# Patient Record
Sex: Male | Born: 2000 | Race: Black or African American | Hispanic: No | Marital: Single | Smoking: Current some day smoker
Health system: Southern US, Community
[De-identification: ages and names within clinical notes are randomized; demographics above are authoritative.]

## PROBLEM LIST (undated history)

## (undated) DIAGNOSIS — J45909 Unspecified asthma, uncomplicated: Secondary | ICD-10-CM

## (undated) HISTORY — PX: APPENDECTOMY: SHX54

---
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
## 2022-03-06 ENCOUNTER — Encounter (HOSPITAL_COMMUNITY): Payer: Self-pay | Admitting: Emergency Medicine

## 2022-03-06 ENCOUNTER — Emergency Department (HOSPITAL_COMMUNITY)
Admission: EM | Admit: 2022-03-06 | Discharge: 2022-03-06 | Discharge status: Against Medical Advice | Payer: Self-pay | Attending: Emergency Medicine | Admitting: Emergency Medicine

## 2022-03-06 ENCOUNTER — Emergency Department (HOSPITAL_COMMUNITY): Payer: Self-pay

## 2022-03-06 ENCOUNTER — Other Ambulatory Visit: Payer: Self-pay

## 2022-03-06 DIAGNOSIS — R059 Cough, unspecified: Secondary | ICD-10-CM | POA: Insufficient documentation

## 2022-03-06 DIAGNOSIS — J45909 Unspecified asthma, uncomplicated: Secondary | ICD-10-CM | POA: Insufficient documentation

## 2022-03-06 DIAGNOSIS — R0981 Nasal congestion: Secondary | ICD-10-CM | POA: Insufficient documentation

## 2022-03-06 DIAGNOSIS — Z20822 Contact with and (suspected) exposure to covid-19: Secondary | ICD-10-CM | POA: Insufficient documentation

## 2022-03-06 DIAGNOSIS — Z5321 Procedure and treatment not carried out due to patient leaving prior to being seen by health care provider: Secondary | ICD-10-CM | POA: Insufficient documentation

## 2022-03-06 LAB — RESP PANEL BY RT-PCR (FLU A&B, COVID) ARPGX2
Influenza A by PCR: NEGATIVE
Influenza B by PCR: NEGATIVE
SARS Coronavirus 2 by RT PCR: NEGATIVE

## 2022-03-06 MED ORDER — ALBUTEROL SULFATE HFA 108 (90 BASE) MCG/ACT IN AERS
2.0000 | INHALATION_SPRAY | Freq: Once | RESPIRATORY_TRACT | Status: AC
  Administered 2022-03-06: 2 via RESPIRATORY_TRACT
  Filled 2022-03-06: qty 6.7

## 2022-03-06 NOTE — ED Triage Notes (Signed)
Pt reports cough, chest congestion, wheezing starting today, pt reports hx of asthma; denies n/v/d/abd pain/weakness

## 2022-03-06 NOTE — ED Provider Triage Note (Signed)
Emergency Medicine Provider Triage Evaluation Note  Daniel Mooney , a 21 y.o. male  was evaluated in triage.  Pt complains of cough and congestion that began today.  Feels like he is wheezing.  Denies sick contacts or known covid exposures.  Hx of asthma, does not have inhaler to use.  Review of Systems  Positive: Congestion, cough, wheezing Negative: fever  Physical Exam  BP 124/80 (BP Location: Right Arm)   Pulse 64   Temp 98.1 F (36.7 C) (Oral)   Resp 15   Ht 6\' 1"  (1.854 m)   Wt 68.9 kg   SpO2 100%   BMI 20.05 kg/m  Gen:   Awake, no distress   Resp:  Normal effort, expiratory wheezes noted MSK:   Moves extremities without difficulty  Other:    Medical Decision Making  Medically screening exam initiated at 3:41 AM.  Appropriate orders placed.  Daniel Mooney was informed that the remainder of the evaluation will be completed by another provider, this initial triage assessment does not replace that evaluation, and the importance of remaining in the ED until their evaluation is complete.  Cough, congestion.  Some wheezing on exam but in NAD, VSS.  Covid/flu screen sent, will obtain CXR.  Albuterol inhaler given for use.   Larene Pickett, PA-C 03/06/22 [phone]

## 2022-03-06 NOTE — ED Notes (Signed)
X2 no response and not visible in the lobby 

## 2022-06-05 ENCOUNTER — Ambulatory Visit (HOSPITAL_COMMUNITY)
Admission: EM | Admit: 2022-06-05 | Discharge: 2022-06-05 | Disposition: A | Discharge status: Home / Self Care | Payer: Self-pay

## 2022-06-06 ENCOUNTER — Ambulatory Visit (HOSPITAL_COMMUNITY): Payer: Self-pay

## 2022-07-11 ENCOUNTER — Emergency Department (HOSPITAL_COMMUNITY)
Admission: EM | Admit: 2022-07-11 | Discharge: 2022-07-11 | Disposition: A | Discharge status: Home / Self Care | Payer: Self-pay | Attending: Emergency Medicine | Admitting: Emergency Medicine

## 2022-07-11 ENCOUNTER — Other Ambulatory Visit: Payer: Self-pay

## 2022-07-11 ENCOUNTER — Encounter (HOSPITAL_COMMUNITY): Payer: Self-pay

## 2022-07-11 DIAGNOSIS — Z7951 Long term (current) use of inhaled steroids: Secondary | ICD-10-CM | POA: Insufficient documentation

## 2022-07-11 DIAGNOSIS — J45909 Unspecified asthma, uncomplicated: Secondary | ICD-10-CM | POA: Insufficient documentation

## 2022-07-11 MED ORDER — ALBUTEROL SULFATE HFA 108 (90 BASE) MCG/ACT IN AERS: 2.0000 | INHALATION_SPRAY | Freq: Four times a day (QID) | RESPIRATORY_TRACT | 3 refills | Status: DC | PRN

## 2022-07-11 NOTE — ED Triage Notes (Signed)
Reports having asthma flare up and needing refill on asthma medications.

## 2022-07-11 NOTE — ED Provider Notes (Signed)
[facility] Provider Note   CSN: [id number] Arrival date & time: 07/11/22  [id number]     History  Chief Complaint  Patient presents with   Medication Refill    Daniel Mooney is a 22 y.o. male.  Patient complains of having had an asthma attack this morning.  Patient reports that his albuterol inhaler was out of medication.  Reports he is not currently wheezing he feels fine now  The history is provided by the patient. No language interpreter was used.  Medication Refill Medications/supplies requested:  Albuterol Reason for request:  Medications ran out Medications taken before: yes - see home medications   Patient has complete original prescription information: no        Home Medications Prior to Admission medications   Medication Sig Start Date End Date Taking? Authorizing Provider  albuterol (VENTOLIN HFA) 108 (90 Base) MCG/ACT inhaler Inhale 2 puffs into the lungs every 6 (six) hours as needed for wheezing or shortness of breath. 07/11/22  Yes Fransico Meadow, PA-C  cetirizine (ZYRTEC ALLERGY) 10 MG tablet Take 1 tablet (10 mg total) by mouth daily. 05/26/18   Willadean Carol, MD  diphenhydrAMINE (BENADRYL) 25 MG tablet Take 1 tablet (25 mg total) by mouth every 6 (six) hours as needed for itching or allergies (Rash). 08/15/14   Antonietta Breach, PA-C  EPINEPHrine (EPIPEN 2-PAK) 0.3 mg/0.3 mL IJ SOAJ injection Inject 0.3 mLs (0.3 mg total) into the muscle once as needed (for severe allergic reaction). CAll 911 immediately if you have to use this medicine 08/15/14   Antonietta Breach, PA-C  fluticasone (FLOVENT HFA) 110 MCG/ACT inhaler Inhale 2 puffs into the lungs 2 (two) times daily. 05/26/18   Willadean Carol, MD  ibuprofen (ADVIL,MOTRIN) 400 MG tablet Take 1 tablet (400 mg total) by mouth every 6 (six) hours as needed. 10/24/17   Zigmund Gottron, NP  predniSONE (DELTASONE) 20 MG tablet Take 3 tablets (60 mg total) by mouth daily. For 4 more  days 02/02/17   Domenic Moras, PA-C      Allergies    Shrimp [shellfish allergy] and Penicillins    Review of Systems   Review of Systems  All other systems reviewed and are negative.   Physical Exam Updated Vital Signs BP 115/74 (BP Location: Right Arm)   Pulse 66   Temp 97.9 F (36.6 C) (Oral)   Resp 14   Ht 6' 1"$  (1.854 m)   Wt 68.9 kg   SpO2 100%   BMI 20.05 kg/m  Physical Exam Vitals and nursing note reviewed.  Constitutional:      Appearance: He is well-developed.  HENT:     Head: Normocephalic.  Cardiovascular:     Rate and Rhythm: Normal rate.  Pulmonary:     Effort: Pulmonary effort is normal.  Abdominal:     General: There is no distension.  Musculoskeletal:        General: Normal range of motion.     Cervical back: Normal range of motion.  Neurological:     Mental Status: He is alert and oriented to person, place, and time.     ED Results / Procedures / Treatments   Labs (all labs ordered are listed, but only abnormal results are displayed) Labs Reviewed - No data to display  EKG None  Radiology No results found.  Procedures Procedures    Medications Ordered in ED Medications - No data to display  ED Course/ Medical Decision  Making/ A&P                             Medical Decision Making She reports he had an asthma attack and ran out of his albuterol he is here requesting a prescription for albuterol  Risk Prescription drug management. Risk Details: Is not having any current wheezing his oxygen saturations are at 100% he looks well overall patient is given a prescription for albuterol           Final Clinical Impression(s) / ED Diagnoses Final diagnoses:  Moderate asthma without complication, unspecified whether persistent    Rx / DC Orders ED Discharge Orders          Ordered    albuterol (VENTOLIN HFA) 108 (90 Base) MCG/ACT inhaler  Every 6 hours PRN        07/11/22 0921          An After Visit Summary was  printed and given to the patient.     Fransico Meadow, [place] 07/11/22 [id number]    Varney Biles, MD 07/14/22 [phone]

## 2022-09-03 ENCOUNTER — Other Ambulatory Visit: Payer: Self-pay

## 2022-09-03 ENCOUNTER — Encounter (HOSPITAL_COMMUNITY): Payer: Self-pay | Admitting: Emergency Medicine

## 2022-09-03 ENCOUNTER — Emergency Department (HOSPITAL_COMMUNITY)
Admission: EM | Admit: 2022-09-03 | Discharge: 2022-09-04 | Disposition: A | Discharge status: Home / Self Care | Payer: Self-pay | Attending: Emergency Medicine | Admitting: Emergency Medicine

## 2022-09-03 ENCOUNTER — Emergency Department (HOSPITAL_COMMUNITY): Payer: Self-pay

## 2022-09-03 DIAGNOSIS — N451 Epididymitis: Secondary | ICD-10-CM

## 2022-09-03 LAB — URINALYSIS, ROUTINE W REFLEX MICROSCOPIC
Bilirubin Urine: NEGATIVE
Glucose, UA: NEGATIVE mg/dL
Hgb urine dipstick: NEGATIVE
Ketones, ur: NEGATIVE mg/dL
Nitrite: NEGATIVE
Protein, ur: NEGATIVE mg/dL
Specific Gravity, Urine: 1.016 (ref 1.005–1.030)
pH: 5 (ref 5.0–8.0)

## 2022-09-03 MED ORDER — DOXYCYCLINE HYCLATE 100 MG PO TABS: 100.0000 mg | ORAL_TABLET | Freq: Two times a day (BID) | ORAL | 0 refills | Status: AC

## 2022-09-03 MED ORDER — CEFTRIAXONE SODIUM 1 G IJ SOLR
500.0000 mg | Freq: Once | INTRAMUSCULAR | Status: AC
  Administered 2022-09-04: 500 mg via INTRAMUSCULAR
  Filled 2022-09-03: qty 10

## 2022-09-03 MED ORDER — LIDOCAINE HCL (PF) 1 % IJ SOLN
1.0000 mL | Freq: Once | INTRAMUSCULAR | Status: AC
  Administered 2022-09-04: 2.1 mL
  Filled 2022-09-03: qty 30

## 2022-09-03 NOTE — ED Triage Notes (Signed)
Pt in with R groin pain and swelling since 3/27. Pt states he "had intercourse on 3/26 and didn't finish, noticed these symptoms the next day". Denies any problems with urination, is requesting STI check.

## 2022-09-03 NOTE — ED Provider Triage Note (Signed)
Emergency Medicine Provider Triage Evaluation Note  Daniel Mooney , a 22 y.o. male  was evaluated in triage.  Pt complains of scrotal pain. Report discomfort to both testicles x 5 days after sex. Voice concerns for STI.  Denies penile discharge, dysuria, rash, or swelling.  Review of Systems  Positive: As above Negative: As above  Physical Exam  BP 125/73 (BP Location: Right Arm)   Pulse 80   Temp 98.5 F (36.9 C) (Oral)   Resp 16   Ht 6\' 1"  (1.854 m)   Wt 72.6 kg   SpO2 100%   BMI 21.11 kg/m  Gen:   Awake, no distress   Resp:  Normal effort  MSK:   Moves extremities without difficulty  Other:    Medical Decision Making  Medically screening exam initiated at 8:40 PM.  Appropriate orders placed.  Daniel Mooney was informed that the remainder of the evaluation will be completed by another provider, this initial triage assessment does not replace that evaluation, and the importance of remaining in the ED until their evaluation is complete.     Domenic Moras, PA-C 09/03/22 2040

## 2022-09-03 NOTE — Discharge Instructions (Signed)
You have been treated in the emergency department for an infection, possibly sexually transmitted.   Please take all of your antibiotics until finished!   You may develop abdominal discomfort or diarrhea from the antibiotic.  You may help offset this with probiotics which you can buy or get in yogurt. Do not eat  or take the probiotics until 2 hours after your antibiotic.    Results of your gonorrhea and chlamydia tests are pending and you will be notified if they are positive. Please refrain from intercourse for 7 days and until all sex partners (within previous 60 days) are evaluated and/or treated as well. Please follow up with your primary care provider for continued care and further STD evaluation.  It is very important to practice safe sex and use condoms when sexually active. If your results are positive you need to notify all sexual partners so they can be treated as well. The website https://dean.info/ can be used to send anonymous text messages or emails to alert sexual contacts. Follow up with your doctor, or OBGYN in regards to today's visit.   Gonorrhea and Chlamydia SYMPTOMS  In females, symptoms may go unnoticed. Symptoms that are more noticeable can include:  Belly (abdominal) pain.  Painful intercourse.  Watery mucous-like discharge from the vagina.  Miscarriage.  Discomfort when urinating.  Inflammation of the rectum.  Abnormal gray-green frothy vaginal discharge  Vaginal itching and irritatio  Itching and irritation of the area outside the vagina.   Painful urination.  Bleeding after sexual intercourse.  In males, symptoms include:  Burning with urination.  Pain in the testicles.  Watery mucous-like discharge from the penis.  It can cause longstanding (chronic) pelvic pain after frequent infections.  TREATMENT  PID can cause women to not be able to have children (sterile) if left untreated or if half-treated.  It is important to finish ALL medications given to  you.  This is a sexually transmitted infection. So you are also at risk for other sexually transmitted diseases, including HIV (AIDS), it is recommended that you get tested. HOME CARE INSTRUCTIONS  Warning: This infection is contagious. Do not have sex until treatment is completed. Follow up at your caregiver's office or the clinic to which you were referred. If your diagnosis (learning what is wrong) is confirmed by culture or some other method, your recent sexual contacts need treatment. Even if they are symptom free or have a negative culture or evaluation, they should be treated.  PREVENTION  Women should use sanitary pads instead of tampons for vaginal discharge.  Wipe front to back after using the toilet and avoid douching.   Practice safe sex, use condoms, have only one sex partner and be sure your sex partner is not having sex with others.  Ask your caregiver to test you for chlamydia at your regular checkups or sooner if you are having symptoms.  Ask for further information if you are pregnant.  SEEK IMMEDIATE MEDICAL CARE IF:  You develop an oral temperature above 102 F (38.9 C), not controlled by medications or lasting more than 2 days.  You develop an increase in pain.  You develop any type of abnormal discharge.  You develop vaginal bleeding and it is not time for your period.  You develop painful intercourse.   Bacterial Vaginosis  Bacterial vaginosis (BV) is a vaginal infection where the normal balance of bacteria in the vagina is disrupted. This is not a sexually transmitted disease and your sexual partners do NOT need  to be treated. CAUSES  The cause of BV is not fully understood. BV develops when there is an increase or imbalance of harmful bacteria.  Some activities or behaviors can upset the normal balance of bacteria in the vagina and put women at increased risk including:  Having a new sex partner or multiple sex partners.  Douching.  Using an intrauterine device (IUD)  for contraception.  It is not clear what role sexual activity plays in the development of BV. However, women that have never had sexual intercourse are rarely infected with BV.  Women do not get BV from toilet seats, bedding, swimming pools or from touching objects around them.   SYMPTOMS  Grey vaginal discharge.  A fish-like odor with discharge, especially after sexual intercourse.  Itching or burning of the vagina and vulva.  Burning or pain with urination.  Some women have no signs or symptoms at all.   TREATMENT  Sometimes BV will clear up without treatment.  BV may be treated with antibiotics.  BV can recur after treatment. If this happens, a second round of antibiotics will often be prescribed.  HOME CARE INSTRUCTIONS  Finish all medication as directed by your caregiver.  Do not have sex until treatment is completed.  Do NOT drink any alcoholic beverages while being treated  with Metronidazole (Flagyl). This will cause a severe reaction inducing vomiting.  RESOURCE GUIDE  Dental Problems  Patients with Medicaid: [facility] Phone:  [phone]                                                  Phone:  [phone]  If unable to pay or uninsured, contact:  [facility] or [facility]. to become qualified for the adult dental clinic.  Chronic Pain Problems Contact [facility]  [phone] Patients need to be referred by their primary care doctor.  Insufficient Money for Medicine Contact [employer]:  call "211" or [place] [phone].  No Primary Care Doctor Call [facility]  [phone] Other agencies that provide inexpensive medical care    [place]  [phone]    [facility] Internal Medicine  [place]  [phone]    [facility]  [phone]    [facility]  [place]  [place]  [phone] [place]   [phone] (emergency services [phone])  [facility]  [phone] [facility] [phone] [facility] [place] [phone] [place] [phone] Residential & Outpatient Substance Abuse Program  [phone]  Abuse/Neglect [place] [phone] [place] [phone] (After Hours)  Emergency [place] [phone]  [place] [facility] [place] [phone] [place] [phone]  MRSA  Hotline #:   ([phone]    [facility] Dept. [address]      [place] Phone:  [phone]                                   Phone:  [phone]                 Phone:  [place] Phone:  [place] [phone] (After Hours)

## 2022-09-03 NOTE — ED Provider Notes (Signed)
[place] EMERGENCY DEPARTMENT AT [facility] Provider Note   CSN: [id number] Arrival date & time: 09/03/22  1909     History  Chief Complaint  Patient presents with   Groin Pain    Daniel Mooney is a 22 y.o. male.   Groin Pain  Patient is a 22 year old male with no pertinent past medical history present emergency room today with complaints of right testicle pain since approximately 4 5 days ago.  He states that he has had unprotected sex with male partners over the past few weeks.  He states that he noticed the testicle pain in his right testicle approximately 24 hours after he had sex with his male partner however he had had prior sexual encounters with her without protection.  He denies any penile discharge no abdominal pain nausea vomiting fevers lightheadedness or dizziness no urinary frequency urgency dysuria or hematuria.     Home Medications Prior to Admission medications   Medication Sig Start Date End Date Taking? Authorizing Provider  doxycycline (VIBRA-TABS) 100 MG tablet Take 1 tablet (100 mg total) by mouth 2 (two) times daily for 7 days. 09/03/22 09/10/22 Yes Noemi Ishmael S, PA  albuterol (VENTOLIN HFA) 108 (90 Base) MCG/ACT inhaler Inhale 2 puffs into the lungs every 6 (six) hours as needed for wheezing or shortness of breath. 07/11/22   Fransico Meadow, PA-C  cetirizine (ZYRTEC ALLERGY) 10 MG tablet Take 1 tablet (10 mg total) by mouth daily. 05/26/18   Willadean Carol, MD  diphenhydrAMINE (BENADRYL) 25 MG tablet Take 1 tablet (25 mg total) by mouth every 6 (six) hours as needed for itching or allergies (Rash). 08/15/14   Antonietta Breach, PA-C  EPINEPHrine (EPIPEN 2-PAK) 0.3 mg/0.3 mL IJ SOAJ injection Inject 0.3 mLs (0.3 mg total) into the muscle once as needed (for severe allergic reaction). CAll 911 immediately if you have to use this medicine 08/15/14   Antonietta Breach, PA-C  fluticasone (FLOVENT HFA) 110 MCG/ACT inhaler Inhale 2 puffs into the lungs 2  (two) times daily. 05/26/18   Willadean Carol, MD  ibuprofen (ADVIL,MOTRIN) 400 MG tablet Take 1 tablet (400 mg total) by mouth every 6 (six) hours as needed. 10/24/17   Zigmund Gottron, NP  predniSONE (DELTASONE) 20 MG tablet Take 3 tablets (60 mg total) by mouth daily. For 4 more days 02/02/17   Domenic Moras, PA-C      Allergies    Shrimp [shellfish allergy] and Penicillins    Review of Systems   Review of Systems  Physical Exam Updated Vital Signs BP 125/73 (BP Location: Right Arm)   Pulse 80   Temp 98.5 F (36.9 C) (Oral)   Resp 16   Ht 6\' 1"  (1.854 m)   Wt 72.6 kg   SpO2 100%   BMI 21.11 kg/m  Physical Exam Vitals and nursing note reviewed.  Constitutional:      General: He is not in acute distress.    Appearance: Normal appearance. He is not ill-appearing.  HENT:     Head: Normocephalic and atraumatic.  Eyes:     General: No scleral icterus.       Right eye: No discharge.        Left eye: No discharge.     Conjunctiva/sclera: Conjunctivae normal.  Pulmonary:     Effort: Pulmonary effort is normal.     Breath sounds: No stridor.  Genitourinary:    Comments: R testicle TTP Normal lie of testes  Neurological:  Mental Status: He is alert and oriented to person, place, and time. Mental status is at baseline.     ED Results / Procedures / Treatments   Labs (all labs ordered are listed, but only abnormal results are displayed) Labs Reviewed  URINALYSIS, ROUTINE W REFLEX MICROSCOPIC - Abnormal; Notable for the following components:      Result Value   Leukocytes,Ua SMALL (*)    Bacteria, UA RARE (*)    All other components within normal limits  RPR  HIV ANTIBODY (ROUTINE TESTING W REFLEX)  GC/CHLAMYDIA PROBE AMP ([place]) NOT AT [facility]    EKG None  Radiology US SCROTUM W/DOPPLER  Result Date: 09/03/2022 CLINICAL DATA:  Scrotal pain for 5 days EXAM: SCROTAL ULTRASOUND DOPPLER ULTRASOUND OF THE TESTICLES TECHNIQUE: Complete ultrasound examination of  the testicles, epididymis, and other scrotal structures was performed. Color and spectral Doppler ultrasound were also utilized to evaluate blood flow to the testicles. COMPARISON:  10/21/2019 FINDINGS: Right testicle Measurements: 3.3 x 5.0 x 2.6 cm. No mass or microlithiasis visualized. Left testicle Measurements: 3.3 x 5.3 x 2.7 cm. No mass or microlithiasis visualized. Right epididymis: The right epididymis is enlarged and hypervascular, consistent with epididymitis. Left epididymis:  Normal in size and appearance. Hydrocele:  Trace bilateral hydroceles. Varicocele:  Bilateral varicoceles, left greater than right. Pulsed Doppler interrogation of both testes demonstrates normal low resistance arterial and venous waveforms bilaterally. IMPRESSION: 1. Enlarged hypervascular right epididymis consistent with epididymitis. 2. Bilateral varicoceles, left greater than right. 3. Trace bilateral hydroceles. 4. Normal appearance of the testicles. Electronically Signed   By: Randa Ngo M.D.   On: 09/03/2022 21:56    Procedures Procedures    Medications Ordered in ED Medications  cefTRIAXone (ROCEPHIN) injection 500 mg (has no administration in time range)  lidocaine (PF) (XYLOCAINE) 1 % injection 1-2.1 mL (has no administration in time range)    ED Course/ Medical Decision Making/ A&P                             Medical Decision Making  Patient is a 22 year old male with no pertinent past medical history present emergency room today with complaints of right testicle pain since approximately 4 5 days ago.  He states that he has had unprotected sex with male partners over the past few weeks.  He states that he noticed the testicle pain in his right testicle approximately 24 hours after he had sex with his male partner however he had had prior sexual encounters with her without protection.  He denies any penile discharge no abdominal pain nausea vomiting fevers lightheadedness or dizziness no urinary  frequency urgency dysuria or hematuria.   R testicle w evidence of epididymitis  IMPRESSION:  1. Enlarged hypervascular right epididymis consistent with  epididymitis.  2. Bilateral varicoceles, left greater than right.  3. Trace bilateral hydroceles.  4. Normal appearance of the testicles.   UA unremarkable some rare bacteria and small leukocytes but no urinary symptoms.  Gonorrhea chlamydia, HIV, RPR all pending.  Normal vital signs well-appearing abdomen soft nontender.  Will discharge home with treatment for epididymitis given age and risk factors will treat as STI related.  Patient is not at risk for enteric organisms.  Rocephin IM given here and doxycycline for home.  Will follow-up with the health department.  Final Clinical Impression(s) / ED Diagnoses Final diagnoses:  Epididymitis    Rx / DC Orders ED Discharge Orders  Ordered    doxycycline (VIBRA-TABS) 100 MG tablet  2 times daily        09/03/22 2255              Gailen Shelter, [place] 09/03/22 2345    Nira Conn, MD 09/08/22 1739

## 2022-09-04 LAB — GC/CHLAMYDIA PROBE AMP (~~LOC~~) NOT AT ARMC
Chlamydia: NEGATIVE
Comment: NEGATIVE
Comment: NORMAL
Neisseria Gonorrhea: NEGATIVE

## 2022-09-04 LAB — RPR: RPR Ser Ql: NONREACTIVE

## 2022-09-04 LAB — HIV ANTIBODY (ROUTINE TESTING W REFLEX): HIV Screen 4th Generation wRfx: NONREACTIVE

## 2022-09-04 NOTE — ED Notes (Signed)
Patient verbalizes understanding of discharge instructions. Opportunity for questioning and answers were provided. Armband removed by staff, pt discharged from ED. Ambulated out to lobby  

## 2022-11-03 LAB — AMB RESULTS CONSOLE CBG: Glucose: 149

## 2022-11-05 NOTE — Progress Notes (Unsigned)
Patient does not have PCP, was given a Get Care Now resource paper. Patient given Food insecurity resource for SDOH need.

## 2022-12-10 ENCOUNTER — Encounter: Payer: Self-pay | Admitting: *Deleted

## 2022-12-10 NOTE — Progress Notes (Unsigned)
Pt attended 11/03/22 screening event where b/p was 120/78 and blood sugar was 149. At the event pt noted he did not have a PCP and was given a Get Care Now flyer; and he identified a food insecurity for which we was given resources. Chart review indicates pt has used Urgent care and ED for care over the past year. Calls made to try to contact pt about connection to a PCP.

## 2023-02-13 ENCOUNTER — Encounter: Payer: Self-pay | Admitting: *Deleted

## 2023-02-13 NOTE — Progress Notes (Signed)
Pt attended 11/05/22 screening event where his b/p was 120/78 and his blood sugar was 149 . The pt did not identify a PCP at the event and did identify a food insecurity for which the pt was given a resource list as well as a Get Care Now flyer. During the initial f/u, health equity team member was unable to contact pt by phone and sent letter with event results, another Get Care Now flyer (in case needed), a [facility] flyer. In addition, since pt has been to the ED and no insurance has been listed on his account, Medicaid and ACA info also sent, along with a list of food pantries and free meal sites since pt listed a food insecurity at the event Since current chart review does not demonstrate any [facility] documentation since event for PCP or other healthcare encounter, same info mailed again to pt with request to please establish care with PCP.

## 2023-03-31 ENCOUNTER — Emergency Department (HOSPITAL_COMMUNITY)
Admission: EM | Admit: 2023-03-31 | Discharge: 2023-03-31 | Disposition: A | Discharge status: Home / Self Care | Payer: Self-pay | Attending: Emergency Medicine | Admitting: Emergency Medicine

## 2023-03-31 ENCOUNTER — Other Ambulatory Visit: Payer: Self-pay

## 2023-03-31 ENCOUNTER — Encounter (HOSPITAL_COMMUNITY): Payer: Self-pay | Admitting: Pharmacy Technician

## 2023-03-31 ENCOUNTER — Emergency Department (HOSPITAL_COMMUNITY): Payer: Self-pay

## 2023-03-31 DIAGNOSIS — F17210 Nicotine dependence, cigarettes, uncomplicated: Secondary | ICD-10-CM | POA: Insufficient documentation

## 2023-03-31 DIAGNOSIS — Z7951 Long term (current) use of inhaled steroids: Secondary | ICD-10-CM | POA: Insufficient documentation

## 2023-03-31 DIAGNOSIS — J4521 Mild intermittent asthma with (acute) exacerbation: Secondary | ICD-10-CM | POA: Insufficient documentation

## 2023-03-31 DIAGNOSIS — Z20822 Contact with and (suspected) exposure to covid-19: Secondary | ICD-10-CM | POA: Insufficient documentation

## 2023-03-31 LAB — BASIC METABOLIC PANEL
Anion gap: 12 (ref 5–15)
BUN: 7 mg/dL (ref 6–20)
CO2: 23 mmol/L (ref 22–32)
Calcium: 9.4 mg/dL (ref 8.9–10.3)
Chloride: 105 mmol/L (ref 98–111)
Creatinine, Ser: 1.08 mg/dL (ref 0.61–1.24)
GFR, Estimated: >60 mL/min (ref >60)
Glucose, Bld: 85 mg/dL (ref 70–99)
Potassium: 4 mmol/L (ref 3.5–5.1)
Sodium: 140 mmol/L (ref 135–145)

## 2023-03-31 LAB — CBC WITH DIFFERENTIAL/PLATELET
Abs Immature Granulocytes: 0 10*3/uL (ref 0.00–0.07)
Basophils Absolute: 0 10*3/uL (ref 0.0–0.1)
Basophils Relative: 1 %
Eosinophils Absolute: 0.1 10*3/uL (ref 0.0–0.5)
Eosinophils Relative: 3 %
HCT: 34.5 % — ABNORMAL LOW (ref 39.0–52.0)
Hemoglobin: 11.4 g/dL — ABNORMAL LOW (ref 13.0–17.0)
Immature Granulocytes: 0 %
Lymphocytes Relative: 33 %
Lymphs Abs: 1.4 10*3/uL (ref 0.7–4.0)
MCH: 29.2 pg (ref 26.0–34.0)
MCHC: 33 g/dL (ref 30.0–36.0)
MCV: 88.2 fL (ref 80.0–100.0)
Monocytes Absolute: 0.3 10*3/uL (ref 0.1–1.0)
Monocytes Relative: 7 %
Neutro Abs: 2.5 10*3/uL (ref 1.7–7.7)
Neutrophils Relative %: 56 %
Platelets: 146 10*3/uL — ABNORMAL LOW (ref 150–400)
RBC: 3.91 MIL/uL — ABNORMAL LOW (ref 4.22–5.81)
RDW: 12.8 % (ref 11.5–15.5)
WBC: 4.3 10*3/uL (ref 4.0–10.5)
nRBC: 0 % (ref 0.0–0.2)

## 2023-03-31 LAB — SARS CORONAVIRUS 2 BY RT PCR: SARS Coronavirus 2 by RT PCR: NEGATIVE

## 2023-03-31 MED ORDER — MAGNESIUM SULFATE 2 GM/50ML IV SOLN
2.0000 g | Freq: Once | INTRAVENOUS | Status: AC
  Administered 2023-03-31: 2 g via INTRAVENOUS
  Filled 2023-03-31: qty 50

## 2023-03-31 MED ORDER — PREDNISONE 10 MG PO TABS: 20.0000 mg | ORAL_TABLET | Freq: Every day | ORAL | 0 refills | Status: AC

## 2023-03-31 MED ORDER — IPRATROPIUM-ALBUTEROL 0.5-2.5 (3) MG/3ML IN SOLN
3.0000 mL | Freq: Once | RESPIRATORY_TRACT | Status: AC
  Administered 2023-03-31: 3 mL via RESPIRATORY_TRACT
  Filled 2023-03-31: qty 3

## 2023-03-31 MED ORDER — METHYLPREDNISOLONE SODIUM SUCC 125 MG IJ SOLR
125.0000 mg | Freq: Once | INTRAMUSCULAR | Status: AC
  Administered 2023-03-31: 125 mg via INTRAVENOUS
  Filled 2023-03-31: qty 2

## 2023-03-31 MED ORDER — ALBUTEROL SULFATE HFA 108 (90 BASE) MCG/ACT IN AERS: 2.0000 | INHALATION_SPRAY | Freq: Four times a day (QID) | RESPIRATORY_TRACT | 3 refills | Status: AC | PRN

## 2023-03-31 NOTE — ED Triage Notes (Signed)
Pt here with shob onset this morning. Hx asthma, ran out of inhaler last week.

## 2023-03-31 NOTE — ED Provider Notes (Signed)
[place] EMERGENCY DEPARTMENT AT [facility] Provider Note   CSN: [id number] Arrival date & time: 03/31/23  5784     History  Chief Complaint  Patient presents with   Asthma    Daniel Mooney is a 22 y.o. male history of asthma presented for an asthma exacerbation.  Patient states that he was smoking cigarettes last night and woke up this morning wheezing.  Patient states he is out of his albuterol inhaler and that he is having asthma exacerbation.  Patient states he just needs a breathing treatment.  Patient denied chest pain, Donnell pain, nauseous vomiting, recent fevers, sick contacts, headache, change in sensation/motor skills, cough, hemoptysis, leg swelling.  Home Medications Prior to Admission medications   Medication Sig Start Date End Date Taking? Authorizing Provider  predniSONE (DELTASONE) 10 MG tablet Take 2 tablets (20 mg total) by mouth daily. 03/31/23  Yes Shigeko Manard, [place] Gust, PA-C  albuterol (VENTOLIN HFA) 108 (90 Base) MCG/ACT inhaler Inhale 2 puffs into the lungs every 6 (six) hours as needed for wheezing or shortness of breath. 03/31/23   Netta Corrigan, PA-C  cetirizine (ZYRTEC ALLERGY) 10 MG tablet Take 1 tablet (10 mg total) by mouth daily. 05/26/18   Vicki Mallet, MD  diphenhydrAMINE (BENADRYL) 25 MG tablet Take 1 tablet (25 mg total) by mouth every 6 (six) hours as needed for itching or allergies (Rash). 08/15/14   Antony Madura, PA-C  EPINEPHrine (EPIPEN 2-PAK) 0.3 mg/0.3 mL IJ SOAJ injection Inject 0.3 mLs (0.3 mg total) into the muscle once as needed (for severe allergic reaction). CAll 911 immediately if you have to use this medicine 08/15/14   Antony Madura, PA-C  fluticasone (FLOVENT HFA) 110 MCG/ACT inhaler Inhale 2 puffs into the lungs 2 (two) times daily. 05/26/18   Vicki Mallet, MD  ibuprofen (ADVIL,MOTRIN) 400 MG tablet Take 1 tablet (400 mg total) by mouth every 6 (six) hours as needed. 10/24/17   Georgetta Haber, NP       Allergies    Shrimp [shellfish allergy] and Penicillins    Review of Systems   Review of Systems  Physical Exam Updated Vital Signs BP 128/84   Pulse (!) 58   Temp (!) 97.2 F (36.2 C)   Resp 18   SpO2 100%  Physical Exam Constitutional:      General: He is not in acute distress. Eyes:     Extraocular Movements: Extraocular movements intact.     Conjunctiva/sclera: Conjunctivae normal.     Pupils: Pupils are equal, round, and reactive to light.  Cardiovascular:     Rate and Rhythm: Normal rate and regular rhythm.     Pulses: Normal pulses.     Heart sounds: Normal heart sounds.  Pulmonary:     Effort: Pulmonary effort is normal. No respiratory distress.     Breath sounds: Wheezing (Right-sided) present.     Comments: Able to speak in full sentences Abdominal:     Palpations: Abdomen is soft.     Tenderness: There is no abdominal tenderness. There is no guarding or rebound.  Musculoskeletal:     Cervical back: Normal range of motion.     Right lower leg: No edema.     Left lower leg: No edema.  Skin:    General: Skin is warm and dry.     Capillary Refill: Capillary refill takes less than 2 seconds.  Neurological:     Mental Status: He is alert and oriented to person, place, and  time.  Psychiatric:        Mood and Affect: Mood normal.     ED Results / Procedures / Treatments   Labs (all labs ordered are listed, but only abnormal results are displayed) Labs Reviewed  CBC WITH DIFFERENTIAL/PLATELET - Abnormal; Notable for the following components:      Result Value   RBC 3.91 (*)    Hemoglobin 11.4 (*)    HCT 34.5 (*)    Platelets 146 (*)    All other components within normal limits  SARS CORONAVIRUS 2 BY RT PCR  BASIC METABOLIC PANEL    EKG EKG Interpretation Date/Time:  Sunday March 31 2023 08:02:03 EDT Ventricular Rate:  85 PR Interval:  133 QRS Duration:  117 QT Interval:  391 QTC Calculation: 465 R Axis:   85  Text Interpretation: Sinus  arrhythmia Nonspecific intraventricular conduction delay Baseline wander in lead(s) V1 Confirmed by Kristine Royal [phone]) on 03/31/2023 8:13:28 AM  Radiology DG Chest 2 View  Result Date: 03/31/2023 CLINICAL DATA:  Short of breath EXAM: CHEST - 2 VIEW COMPARISON:  None Available. FINDINGS: Normal mediastinum and cardiac silhouette. Normal pulmonary vasculature. No evidence of effusion, infiltrate, or pneumothorax. No acute bony abnormality. IMPRESSION: Normal chest radiograph Electronically Signed   By: Genevive Bi M.D.   On: 03/31/2023 09:18    Procedures Procedures    Medications Ordered in ED Medications  ipratropium-albuterol (DUONEB) 0.5-2.5 (3) MG/3ML nebulizer solution 3 mL (3 mLs Nebulization Given 03/31/23 0818)  magnesium sulfate IVPB 2 g 50 mL (0 g Intravenous Stopped 03/31/23 0954)  methylPREDNISolone sodium succinate (SOLU-MEDROL) 125 mg/2 mL injection 125 mg (125 mg Intravenous Given 03/31/23 0840)    ED Course/ Medical Decision Making/ A&P                                 Medical Decision Making Amount and/or Complexity of Data Reviewed Labs: ordered. Radiology: ordered.  Risk Prescription drug management.   Daniel Mooney 22 y.o. presented today for shortness of breath.  Working DDx that I considered at this time includes, but not limited to, asthma/COPD exacerbation, URI, viral illness, anemia, ACS, PE, pneumonia, pleural effusion, lung cancer.  R/o DDx: COPD exacerbation, URI, viral illness, anemia, ACS, PE, pneumonia, pleural effusion, lung cancer: These are considered less likely due to history of present illness, physical exam, labs/imaging findings  Review of prior external notes: 09/03/2022 ED  Unique Tests and My Interpretation:  CBC: Unremarkable BMP: Unremarkable EKG: Sinus 85 bpm, no ST elevations or depressions noted, no blocks noted CXR: Unremarkable COVID: Negative  Social Determinants of Health: none  Discussion with Independent  Historian: None  Discussion of Management of Tests: None  Risk: Medium: prescription drug management  Risk Stratification Score:  PERC: 0  Plan: On exam patient was in no acute distress with stable vitals.  On exam patient did have right-sided wheezing but otherwise had reassuring physical exam.  Patient states this is similar to previous asthma exacerbations and so will give breathing treatment, steroids and mag and obtain basic labs and imaging.  Anticipate discharge with albuterol inhaler refill along with steroids and primary care follow-up.  Patient's lungs clear to auscultation bilaterally after treatment.  Patient states he feels much better and wants to be discharged with a work note.  Will prescribe the steroids and albuterol inhaler encourage further care follow-up.  Labs and imaging all reassuring.  Patient was given return precautions.  Patient stable for discharge at this time.  Patient verbalized understanding of plan.  Discussed smoking cessation with patient and was they were offerred resources to help stop.  Total time was 5 min CPT code 41324.   This chart was dictated using voice recognition software.  Despite best efforts to proofread,  errors can occur which can change the documentation meaning.         Final Clinical Impression(s) / ED Diagnoses Final diagnoses:  Mild intermittent asthma with exacerbation    Rx / DC Orders ED Discharge Orders          Ordered    predniSONE (DELTASONE) 10 MG tablet  Daily        03/31/23 0833    albuterol (VENTOLIN HFA) 108 (90 Base) MCG/ACT inhaler  Every 6 hours PRN        03/31/23 0833              Netta Corrigan, PA-C 03/31/23 1048    Wynetta Fines, MD 03/31/23 1704

## 2023-03-31 NOTE — Discharge Instructions (Addendum)
Please follow-up with your primary care provider regards recent ER visit.  Today you were treated for an asthma exacerbation and I have prescribed for you steroids to take over the next few days.  I have also refilled your albuterol inhaler.  I have attached a work note for you.  Please avoid smoking any items as these will irritate your lungs and cause asthma exacerbations.  If symptoms change or worsen please return to ER.

## 2023-04-27 ENCOUNTER — Emergency Department (HOSPITAL_COMMUNITY)
Admission: EM | Admit: 2023-04-27 | Discharge: 2023-04-27 | Discharge status: Against Medical Advice | Payer: Self-pay | Attending: Emergency Medicine | Admitting: Emergency Medicine

## 2023-04-27 DIAGNOSIS — R799 Abnormal finding of blood chemistry, unspecified: Secondary | ICD-10-CM | POA: Insufficient documentation

## 2023-04-27 DIAGNOSIS — Z5321 Procedure and treatment not carried out due to patient leaving prior to being seen by health care provider: Secondary | ICD-10-CM | POA: Insufficient documentation

## 2023-04-27 NOTE — ED Triage Notes (Signed)
Pt. Stated, I need to get my lab results from October for an asthma attack. I did explain to the pt about Mychart.

## 2023-04-27 NOTE — ED Notes (Addendum)
Pt. Called for room x3 w/ no response.

## 2023-05-14 ENCOUNTER — Encounter: Payer: Self-pay | Admitting: *Deleted

## 2023-05-14 NOTE — Progress Notes (Signed)
Pt attended 11/03/22 screening event where his b/p was 120/78 and his blood sugar was 149. At the event, the pt did not note a PCP name, did document have insurance, documented being a smoker, and notified event staff of a food insecurity, for which he was given resources at the event. During the initial and  60 day and 6 month event f/u, health equity team member unable to contact pt by phone, although vm left for pt to call. Chart review does indicate that pt has used [facility] and [facility] for care prior to the event and once since the event but there are no PCP or other non-ED healthcare encounters visible in [facility] since the event. Since health equity team member still unable to contact pt by phone and there is still no evidence visible that he has connected with a PCP and chart currently states pt does not have insurance coverage, final letter sent to pt with [facility] and community primary care clinic options, as well as Medicaid and [employer] and [facility] financial assistance eligibility info and [employer] resources for food and healthcare, with final food bank/meal resource list. No additional health equity team support scheduled at this time.
